# Patient Record
Sex: Female | Born: 1985 | Race: White | Hispanic: No | Smoking: Never smoker
Health system: Southern US, Community
[De-identification: ages and names within clinical notes are randomized; demographics above are authoritative.]

## PROBLEM LIST (undated history)

## (undated) DIAGNOSIS — Z87891 Personal history of nicotine dependence: Secondary | ICD-10-CM

## (undated) HISTORY — DX: Personal history of nicotine dependence: Z87.891

---
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
## 2007-04-16 ENCOUNTER — Emergency Department (HOSPITAL_COMMUNITY): Admission: EM | Admit: 2007-04-16 | Discharge: 2007-04-16 | Payer: Self-pay | Admitting: Emergency Medicine
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
## 2010-09-28 NOTE — Op Note (Signed)
NAMEKENZLEE, FISHBURN                      ACCOUNT NO.:  [id number]   MEDICAL RECORD NO.:  [id number]                   PATIENT TYPE:  OIB   LOCATION:  2872                                 FACILITY:  [facility]   PHYSICIAN:  Duke Salvia, M.D.               DATE OF BIRTH:  1986-02-08   DATE OF PROCEDURE:  01/06/2004  DATE OF DISCHARGE:  01/06/2004                                 OPERATIVE REPORT   PREOPERATIVE DIAGNOSIS:  Syncope.   POSTOPERATIVE DIAGNOSIS:  Syncope.   OPERATION/PROCEDURE:  Head-up tilt-table testing.   DESCRIPTION OF PROCEDURE:  The patient was equilibrated in the supine  position and then tilted upright at 70 degrees from 30 minutes.  Her blood  pressure and heart rate were essentially stable with a change in her supine  position blood pressure in the 120s into the mid 110s or so.  There were  occasional drops below that with equilibration.  The heart rate was started  in the mid 70s, went up as high as 106 or 109 to 120 and then it  equilibrated.   This is consistent with mild orthostasis but otherwise negative                                               Duke Salvia, M.D.    SCK/MEDQ  D:  01/06/2004  T:  01/08/2004  Job:  [id number]

## 2011-10-16 DIAGNOSIS — Z87440 Personal history of urinary (tract) infections: Secondary | ICD-10-CM | POA: Insufficient documentation

## 2011-11-11 ENCOUNTER — Ambulatory Visit: Payer: Self-pay

## 2011-11-15 ENCOUNTER — Ambulatory Visit (INDEPENDENT_AMBULATORY_CARE_PROVIDER_SITE_OTHER): Payer: BC Managed Care – PPO

## 2011-11-15 VITALS — BP 120/70 | Resp 14 | Ht 68.0 in | Wt 159.0 lb

## 2011-11-15 DIAGNOSIS — Z87891 Personal history of nicotine dependence: Secondary | ICD-10-CM | POA: Insufficient documentation

## 2011-11-15 DIAGNOSIS — Z9889 Other specified postprocedural states: Secondary | ICD-10-CM

## 2011-11-15 DIAGNOSIS — N390 Urinary tract infection, site not specified: Secondary | ICD-10-CM

## 2011-11-15 DIAGNOSIS — Z01419 Encounter for gynecological examination (general) (routine) without abnormal findings: Secondary | ICD-10-CM

## 2011-11-15 DIAGNOSIS — Z87440 Personal history of urinary (tract) infections: Secondary | ICD-10-CM

## 2011-11-15 DIAGNOSIS — Z124 Encounter for screening for malignant neoplasm of cervix: Secondary | ICD-10-CM

## 2011-11-15 HISTORY — DX: Personal history of nicotine dependence: Z87.891

## 2011-11-15 LAB — POCT URINALYSIS DIPSTICK
Bilirubin, UA: NEGATIVE
Blood, UA: NEGATIVE
Nitrite, UA: NEGATIVE
Protein, UA: NEGATIVE
Spec Grav, UA: 1.01
Urobilinogen, UA: NEGATIVE
pH, UA: 6

## 2011-11-15 MED ORDER — NITROFURANTOIN MACROCRYSTAL 50 MG PO CAPS: 50.0000 mg | ORAL_CAPSULE | Freq: Every day | ORAL | Status: AC | PRN

## 2011-11-15 NOTE — Progress Notes (Signed)
Regular Periods: yes Mammogram: no  Monthly Breast Ex.: no Exercise: yes  Tetanus < 10 years: yes Seatbelts: yes  NI. Bladder Functn.: yes Abuse at home: no  Daily BM's: yes Stressful Work: no  Healthy Diet: yes Sigmoid-Colonoscopy: never   Calcium: no Medical problems this year: pt reqs Macrodantin states recurrent UTI's     LAST PAP:02/06/2010  WNL   Contraception: Sronyx  Mammogram:  never  PCP: none  PMH:  No changes   FMH: no changes

## 2011-11-18 LAB — PAP IG W/ RFLX HPV ASCU

## 2011-11-18 LAB — URINE CULTURE

## 2012-08-08 DIAGNOSIS — Z9889 Other specified postprocedural states: Secondary | ICD-10-CM | POA: Insufficient documentation

## 2012-08-08 NOTE — Progress Notes (Signed)
..   Subjective:    Brooke Maynard is a 26y.o SWF, P0020., who presents for an annual exam.   Regular Periods: yes  Mammogram: no   Monthly Breast Ex.: no  Exercise: yes   Tetanus < 10 years: yes  Seatbelts: yes   NI. Bladder Functn.: yes  Abuse at home: no   Daily BM's: yes  Stressful Work: no   Healthy Diet: yes  Sigmoid-Colonoscopy: never   Calcium: no  Medical problems this year: pt reqs Macrodantin states recurrent UTI's   LAST PAP:02/06/2010 WNL  Contraception: Sronyx  Mammogram: never  PCP: none  PMH: No changes  FMH: no changes     History   Social History  . Marital Status: Single    Spouse Name: N/A    Number of Children: N/A  . Years of Education: N/A   Social History Main Topics  . Smoking status: Never Smoker   . Smokeless tobacco: Never Used  . Alcohol Use: 0.5 oz/week    1 drink(s) per week  . Drug Use: No  . Sexually Active: Yes -- Female partner(s)    Birth Control/ Protection: Pill   Other Topics Concern  . None   Social History Narrative  . None    Menstrual cycle:   LMP: Patient's last menstrual period was 10/16/2011.           Cycle: monthly  The following portions of the patient's history were reviewed and updated as appropriate: allergies, current medications, past family history, past medical history, past social history, past surgical history and problem list.  Review of Systems Pertinent items are noted in HPI. Breast:Negative for breast lump,nipple discharge or nipple retraction Gastrointestinal: Negative for abdominal pain, change in bowel habits or rectal bleeding Urinary:see HPI   Objective:    BP 120/70  Resp 14  Ht 5\' 8"  (1.727 m)  Wt 159 lb (72.122 kg)  BMI 24.18 kg/m2  LMP 10/16/2011    Weight:  Wt Readings from Last 1 Encounters:  11/15/11 159 lb (72.122 kg)          BMI: Body mass index is 24.18 kg/(m^2).  General Appearance: Alert, appropriate appearance for age. No acute distress HEENT: Grossly normal Neck /  Thyroid: Supple, no masses, nodes or enlargement Lungs: clear to auscultation bilaterally Back: No CVA tenderness Breast Exam: No dimpling, nipple retraction or discharge. No masses or nodes. and No masses or nodes.No dimpling, nipple retraction or discharge. Cardiovascular: Regular rate and rhythm. S1, S2, no murmur Gastrointestinal: Soft, non-tender, no masses or organomegaly Pelvic Exam: Vulva and vagina appear normal. Bimanual exam reveals normal uterus and adnexa. Rectovaginal: not indicated Lymphatic Exam: Non-palpable nodes in neck, clavicular, axillary, or inguinal regions Skin: no rash or abnormalities Neurologic: Normal gait and speech, no tremor  Psychiatric: Alert and oriented, appropriate affect.   Wet Prep:not applicable Urinalysis:small ketones; trace leuks UPT: Not done   Assessment:    Normal gyn exam    Plan:   urine cx sent: 15,000 colonies, Staph species--not treated; Macrodantin Rx for prn use Mammogram--not due pap smear done, next pap due 2017 return annually or prn STD screening: declined Contraception:oral contraceptives (estrogen/progesterone): Sronyx   C. Denny Levy, CNM

## 2015-05-09 ENCOUNTER — Ambulatory Visit (INDEPENDENT_AMBULATORY_CARE_PROVIDER_SITE_OTHER): Payer: BLUE CROSS/BLUE SHIELD

## 2015-05-09 ENCOUNTER — Encounter: Payer: Self-pay | Admitting: Podiatry

## 2015-05-09 ENCOUNTER — Ambulatory Visit (INDEPENDENT_AMBULATORY_CARE_PROVIDER_SITE_OTHER): Payer: BLUE CROSS/BLUE SHIELD | Admitting: Podiatry

## 2015-05-09 VITALS — BP 98/69 | HR 86 | Resp 16

## 2015-05-09 DIAGNOSIS — M2010 Hallux valgus (acquired), unspecified foot: Secondary | ICD-10-CM | POA: Diagnosis not present

## 2015-05-09 NOTE — Progress Notes (Signed)
   Subjective:    Patient ID: Brooke Maynard, female    DOB: Sep 28, 1985, 28 y.o.   MRN: [id number]  HPI: She presents today as a 29 year old white female with a continuing pain to the forefoot bilaterally. She states that she has bunion deformities which have been bothering her for many years. Recently noting that the right foot is much worse than the left and has injured the right foot recently as she stepped off a step incorrectly caused the joint to be painful. She states there is red and swollen and even purple in appearance. She states that this deformity is resulting in the inability to refer her to wear regular shoe gear and for her to perform her daily activities of life.    Review of Systems  All other systems reviewed and are negative.      Objective:   Physical Exam: 29 year old female history of hallux abductovalgus times many years recently worsening. No apparent distress. Pulses are strongly palpable neurologic sensorium is intact per Semmes-Weinstein monofilament. Deep tendon reflexes are intact bilateral muscle strength is 5 over 5 dorsiflexion plantar flexors and inverters everters all into the musculature is intact. Orthopedic evaluation demonstrates moderate to severe hallux abductovalgus deformities. She also has tailor's bunion deformities. Radiographs confirm an increase in the first metatarsal angle greater than normal value bilateral as well as the fourth intermetatarsal angle. She has tenderness on range of motion of these joints. Cutaneous evaluation of a straight supple well-hydrated cutis no erythema edema cellulitis drainage or odor.      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  Plan: We discussed the etiology pathology conservative versus surgical therapies. At this point I recommended surgical correction [facility] and a  fifth metatarsal osteotomy right foot. I answered questions regarding these procedures today to the best of my ability in layman's terms. We did discuss the possible postop complications which may include but are not limited to postop pain bleeding swelling infection recurrence need for further surgery overcorrection under correction loss of digit loss of limb, loss of life. We dispensed a cam walker and discussed anesthesia in great detail. I will follow-up with her in the near future for surgical intervention.

## 2015-05-09 NOTE — Patient Instructions (Signed)
Pre-Operative Instructions  Congratulations, you have decided to take an important step to improving your quality of life.  You can be assured that the doctors of [facility] will be with you every step of the way.  1. Plan to be at the surgery center/hospital at least 1 (one) hour prior to your scheduled time unless otherwise directed by the surgical center/hospital staff.  You must have a responsible adult accompany you, remain during the surgery and drive you home.  Make sure you have directions to the surgical center/hospital and know how to get there on time. 2. For hospital based surgery you will need to obtain a history and physical form from your family physician within 1 month prior to the date of surgery- we will give you a form for you primary physician.  3. We make every effort to accommodate the date you request for surgery.  There are however, times where surgery dates or times have to be moved.  We will contact you as soon as possible if a change in schedule is required.   4. No Aspirin/Ibuprofen for one week before surgery.  If you are on aspirin, any non-steroidal anti-inflammatory medications (Mobic, Aleve, Ibuprofen) you should stop taking it 7 days prior to your surgery.  You make take Tylenol  For pain prior to surgery.  5. Medications- If you are taking daily heart and blood pressure medications, seizure, reflux, allergy, asthma, anxiety, pain or diabetes medications, make sure the surgery center/hospital is aware before the day of surgery so they may notify you which medications to take or avoid the day of surgery. 6. No food or drink after midnight the night before surgery unless directed otherwise by surgical center/hospital staff. 7. No alcoholic beverages 24 hours prior to surgery.  No smoking 24 hours prior to or 24 hours after surgery. 8. Wear loose pants or shorts- loose enough to fit over bandages, boots, and casts. 9. No slip on shoes, sneakers are best. 10. Bring  your boot with you to the surgery center/hospital.  Also bring crutches or a walker if your physician has prescribed it for you.  If you do not have this equipment, it will be provided for you after surgery. 11. If you have not been contracted by the surgery center/hospital by the day before your surgery, call to confirm the date and time of your surgery. 12. Leave-time from work may vary depending on the type of surgery you have.  Appropriate arrangements should be made prior to surgery with your employer. 13. Prescriptions will be provided immediately following surgery by your doctor.  Have these filled as soon as possible after surgery and take the medication as directed. 14. Remove nail polish on the operative foot. 15. Wash the night before surgery.  The night before surgery wash the foot and leg well with the antibacterial soap provided and water paying special attention to beneath the toenails and in between the toes.  Rinse thoroughly with water and dry well with a towel.  Perform this wash unless told not to do so by your physician.  Enclosed: 1 Ice pack (please put in freezer the night before surgery)   1 Hibiclens skin cleaner   Pre-op Instructions  If you have any questions regarding the instructions, do not hesitate to call our office.  [place]: [address] , [place] [address] [phone]  [place]: [address] [place], [place] [address] [phone]  [place]: [address]  [place], [place] [address] [phone]  Dr. Richard   Tuchman DPM, Dr. Norman Regal DPM Dr. Richard Sikora DPM, Dr. M. Todd Hyatt DPM, Dr. Kathryn Egerton DPM 

## 2015-07-04 ENCOUNTER — Telehealth: Payer: Self-pay | Admitting: *Deleted

## 2015-07-04 NOTE — Telephone Encounter (Signed)
"  My daughter wanted me to call you and set up a date for her surgery finally.  She's already signed the needed forms."  When would she like to schedule?  "She would like to do it on April 21."  That date is available.  She will need to register with the surgical center.  Instructions are in the surgical center brochure.  Surgical center will call with the arrival time a day or two prior to surgery date.  "I'm writing this down, I will pass this information to her."

## 2015-08-31 ENCOUNTER — Other Ambulatory Visit: Payer: Self-pay | Admitting: Podiatry

## 2015-08-31 MED ORDER — CEPHALEXIN 500 MG PO CAPS: 500.0000 mg | ORAL_CAPSULE | Freq: Three times a day (TID) | ORAL | Status: DC

## 2015-08-31 MED ORDER — ONDANSETRON HCL 4 MG PO TABS: 4.0000 mg | ORAL_TABLET | Freq: Three times a day (TID) | ORAL | Status: DC | PRN

## 2015-08-31 MED ORDER — HYDROMORPHONE HCL 4 MG PO TABS: 4.0000 mg | ORAL_TABLET | Freq: Four times a day (QID) | ORAL | Status: DC | PRN

## 2015-09-01 ENCOUNTER — Encounter: Payer: Self-pay | Admitting: Podiatry

## 2015-09-01 DIAGNOSIS — M21541 Acquired clubfoot, right foot: Secondary | ICD-10-CM | POA: Diagnosis not present

## 2015-09-01 DIAGNOSIS — M2011 Hallux valgus (acquired), right foot: Secondary | ICD-10-CM | POA: Diagnosis not present

## 2015-09-04 ENCOUNTER — Telehealth: Payer: Self-pay | Admitting: *Deleted

## 2015-09-04 NOTE — Telephone Encounter (Signed)
Post op courtesy call-Left message instruction pt to not be up on the foot more than 5 minutes/hour, remain in the surgical boot at all times even to sleep and to walk, RICE, leave dressing in place until 1st Post op visit, and to call with concerns.

## 2015-09-05 NOTE — Progress Notes (Signed)
[facility] 09/01/2015 Austin bunionectomy with internal screw fixation right and metatarsal osteotomy 5th met with internal screw fixation right foot.

## 2015-09-07 ENCOUNTER — Encounter: Payer: Self-pay | Admitting: Podiatry

## 2015-09-07 ENCOUNTER — Ambulatory Visit (INDEPENDENT_AMBULATORY_CARE_PROVIDER_SITE_OTHER): Payer: BLUE CROSS/BLUE SHIELD | Admitting: Podiatry

## 2015-09-07 ENCOUNTER — Ambulatory Visit (INDEPENDENT_AMBULATORY_CARE_PROVIDER_SITE_OTHER): Payer: BLUE CROSS/BLUE SHIELD

## 2015-09-07 VITALS — BP 124/60 | HR 86 | Resp 16

## 2015-09-07 DIAGNOSIS — M2011 Hallux valgus (acquired), right foot: Secondary | ICD-10-CM | POA: Diagnosis not present

## 2015-09-07 DIAGNOSIS — Z9889 Other specified postprocedural states: Secondary | ICD-10-CM

## 2015-09-07 NOTE — Progress Notes (Signed)
She presents today with her mother for her first postop visit regarding Austin bunion repair right foot and a fifth metatarsal osteotomy right foot. She states that she uses a walker because she is scared to put weight on the foot she is afraid a squat hurts. She denies fever chills nausea vomiting muscle aches pain is no chest pain. No shortness of breath.  Objective: Vital signs are stable alert and oriented 3. Pulses are strongly palpable. Dry sterile dressing were once removed demonstrates mild edema some ecchymosis no cellulitis drainage or odor mild tenderness on range of motion of the first metatarsophalangeal joint margins appear to be well coapted with sutures intact. Radiograph does demonstrate a first and fifth metatarsal osteotomy with screw fixation intact and in good position.  Assessment: Well-healing surgical foot right.  Plan: Encouraged range of motion exercises redressed the foot today with a dry sterile compressive dressing and I will follow-up with her in 1 week. She is to continue to keep this elevated and dry and perform her exercises.

## 2015-09-19 ENCOUNTER — Encounter: Payer: BLUE CROSS/BLUE SHIELD | Admitting: Podiatry

## 2015-09-21 ENCOUNTER — Ambulatory Visit (INDEPENDENT_AMBULATORY_CARE_PROVIDER_SITE_OTHER): Payer: BLUE CROSS/BLUE SHIELD | Admitting: Podiatry

## 2015-09-21 DIAGNOSIS — Z9889 Other specified postprocedural states: Secondary | ICD-10-CM

## 2015-09-21 DIAGNOSIS — M2011 Hallux valgus (acquired), right foot: Secondary | ICD-10-CM

## 2015-09-22 NOTE — Progress Notes (Signed)
Subjective:     Patient ID: Brooke Maynard, female   DOB: Dec 25, 1985, 30 y.o.   MRN: [id number]  HPI patient presents stating she's doing well with her foot and is able now to walk without significant discomfort or swelling   Review of Systems     Objective:   Physical Exam Neurovascular status intact muscle strength adequate negative Homans sign noted with patient having well structured first metatarsal right and fifth metatarsal right with wound edges well coapted and good alignment noted    Assessment:     Doing well post osteotomy first and fifth metatarsal right    Plan:     X-ray reviewed and reapplied sterile dressing and advised on continued elevation compression and gradual increase in activity. Encourage range of motion of the first MPJ and patient will be reevaluated again in the next 2-3 weeks or earlier if any issues should occur  X-ray report indicates that the screws are in place with good alignment of the first and fifth metatarsal

## 2015-10-05 ENCOUNTER — Encounter: Payer: Self-pay | Admitting: Podiatry

## 2015-10-05 ENCOUNTER — Ambulatory Visit (INDEPENDENT_AMBULATORY_CARE_PROVIDER_SITE_OTHER): Payer: BLUE CROSS/BLUE SHIELD | Admitting: Podiatry

## 2015-10-05 ENCOUNTER — Ambulatory Visit (INDEPENDENT_AMBULATORY_CARE_PROVIDER_SITE_OTHER): Payer: BLUE CROSS/BLUE SHIELD

## 2015-10-05 VITALS — BP 117/88 | HR 79 | Resp 12

## 2015-10-05 DIAGNOSIS — Z9889 Other specified postprocedural states: Secondary | ICD-10-CM

## 2015-10-05 DIAGNOSIS — M2011 Hallux valgus (acquired), right foot: Secondary | ICD-10-CM | POA: Diagnosis not present

## 2015-10-05 NOTE — Progress Notes (Signed)
She presents today date of surgery 09/01/2015 status post first and fifth metatarsal osteotomies. She states that she still has some swelling.  Objective: Vital signs are stable she is alert and oriented 3 minimal edema no saline as drainage or odor. Sutures were removed today margins are well coapted. She has limited range of motion of the first metatarsophalangeal joint secondary to noncompliance. Radiographs confirm well healing osteotomies though they're not completely healed they are in good alignment with good internal fixation.  Assessment: Well-healing surgical foot right.  Plan: Encouraged range of motion exercises I also encouraged her to get back into a loose fitting shoe that would help increase the range of motion. I also will follow up with her in 2-4 weeks for another set of x-rays.

## 2015-10-19 ENCOUNTER — Ambulatory Visit (INDEPENDENT_AMBULATORY_CARE_PROVIDER_SITE_OTHER): Payer: BLUE CROSS/BLUE SHIELD

## 2015-10-19 ENCOUNTER — Encounter: Payer: Self-pay | Admitting: Podiatry

## 2015-10-19 ENCOUNTER — Ambulatory Visit (INDEPENDENT_AMBULATORY_CARE_PROVIDER_SITE_OTHER): Payer: BLUE CROSS/BLUE SHIELD | Admitting: Podiatry

## 2015-10-19 VITALS — BP 117/76 | HR 85 | Resp 16

## 2015-10-19 DIAGNOSIS — M2011 Hallux valgus (acquired), right foot: Secondary | ICD-10-CM

## 2015-10-19 DIAGNOSIS — Z9889 Other specified postprocedural states: Secondary | ICD-10-CM

## 2015-10-19 NOTE — Progress Notes (Signed)
She presents today for follow-up of her surgical foot right date of surgery was 09/01/2015. She states this seems to be doing better as she limps to the room with a pair slide on shoes. She denies any trauma to the foot states that is doing better and that she's been exercising her toes.  Objective: Vital signs are stable she is alert and oriented 3 pulses are palpable right foot. She is status post Austin bunion repair fifth metatarsal osteotomy which are very stiff on attempted range of motion today. Radiographs confirm well healing osteotomies.  Assessment: Status post 6 weeks Austin bunion repair and fifth metatarsal osteotomy right foot. Stiff joints.  Plan: I encouraged range of motion exercises and we are going to send her to [facility] for physical therapy. Follow up with her in 1 month

## 2015-10-20 ENCOUNTER — Telehealth: Payer: Self-pay | Admitting: *Deleted

## 2015-10-20 NOTE — Telephone Encounter (Signed)
Faxed referral and required PT form with pt demographics.

## 2015-11-16 ENCOUNTER — Ambulatory Visit (INDEPENDENT_AMBULATORY_CARE_PROVIDER_SITE_OTHER): Payer: BLUE CROSS/BLUE SHIELD

## 2015-11-16 ENCOUNTER — Ambulatory Visit (INDEPENDENT_AMBULATORY_CARE_PROVIDER_SITE_OTHER): Payer: BLUE CROSS/BLUE SHIELD | Admitting: Podiatry

## 2015-11-16 ENCOUNTER — Encounter: Payer: Self-pay | Admitting: Podiatry

## 2015-11-16 DIAGNOSIS — Z9889 Other specified postprocedural states: Secondary | ICD-10-CM | POA: Diagnosis not present

## 2015-11-16 DIAGNOSIS — M2011 Hallux valgus (acquired), right foot: Secondary | ICD-10-CM | POA: Diagnosis not present

## 2015-11-16 NOTE — Progress Notes (Signed)
She presents today status post ostomy repair fifth metatarsal osteotomy right foot. She states it is sore occasionally getting better. Date of surgery 09/01/2015.  Objective: Vital signs are stable she is alert and oriented 3 there is no erythema edema saline astringent odor surgical site appears to have gone heal uneventfully. She has good correction of the first metatarsophalangeal joint radiographically however she still has some lateral deviation of the hallux right. Fifth metatarsal osteotomy appears to be healing very well with its internal fixation.  Assessment: Healing surgical foot 2-1/2 months.  Plan: I recommended she continue at-home physical therapy and follow up with me on an as-needed basis.

## 2016-06-11 ENCOUNTER — Ambulatory Visit (INDEPENDENT_AMBULATORY_CARE_PROVIDER_SITE_OTHER): Payer: BLUE CROSS/BLUE SHIELD | Admitting: Podiatry

## 2016-06-11 ENCOUNTER — Encounter: Payer: Self-pay | Admitting: Podiatry

## 2016-06-11 ENCOUNTER — Ambulatory Visit (INDEPENDENT_AMBULATORY_CARE_PROVIDER_SITE_OTHER): Payer: BLUE CROSS/BLUE SHIELD

## 2016-06-11 DIAGNOSIS — M79672 Pain in left foot: Secondary | ICD-10-CM | POA: Diagnosis not present

## 2016-06-11 DIAGNOSIS — M79671 Pain in right foot: Secondary | ICD-10-CM | POA: Diagnosis not present

## 2016-06-11 DIAGNOSIS — M2012 Hallux valgus (acquired), left foot: Secondary | ICD-10-CM

## 2016-06-11 NOTE — Progress Notes (Signed)
She presents today for a follow-up of her first metatarsophalangeal joint. She had surgery last April regarding bunion repair and a fifth met osteotomy. She states that the lateral side of the foot is doing fine however she still has some tenderness on palpation plantar aspect of the first metatarsophalangeal joint and states that still tender. She is concerned about doing the left foot says the right foot is not completely well yet.  Objective: Vital signs are stable she is alert and oriented 3. Pulses are palpable. Neurologic sensorium is intact. Deep tendon reflexes are intact. Muscle strength is normal. Radiographic evaluation does demonstrate hallux valgus deformity bilateral though she is a congruous joint of the first metatarsophalangeal joint she has a dislocated joint of the first metatarsophalangeal joint left foot. She does have pain on palpation of the first metatarsophalangeal joint plantarly with the tibial sesamoid right foot.  Assessment: Residual pain status post Austin bunion repair right to tibial sesamoid. Hallux valgus left.  Plan: I'll follow-up with her in about 4 months and discuss surgical intervention regarding the right foot and the left foot.

## 2016-08-20 ENCOUNTER — Ambulatory Visit: Payer: BLUE CROSS/BLUE SHIELD | Admitting: Podiatry

## 2016-09-10 ENCOUNTER — Ambulatory Visit: Payer: BLUE CROSS/BLUE SHIELD | Admitting: Podiatry

## 2017-05-27 ENCOUNTER — Ambulatory Visit (INDEPENDENT_AMBULATORY_CARE_PROVIDER_SITE_OTHER): Payer: 59

## 2017-05-27 ENCOUNTER — Ambulatory Visit: Payer: 59 | Admitting: Podiatry

## 2017-05-27 ENCOUNTER — Encounter: Payer: Self-pay | Admitting: Podiatry

## 2017-05-27 DIAGNOSIS — M2011 Hallux valgus (acquired), right foot: Secondary | ICD-10-CM

## 2017-05-27 DIAGNOSIS — M2012 Hallux valgus (acquired), left foot: Secondary | ICD-10-CM

## 2017-05-27 NOTE — Patient Instructions (Signed)
Pre-Operative Instructions  Congratulations, you have decided to take an important step towards improving your quality of life.  You can be assured that the doctors and staff at [facility] will be with you every step of the way.  Here are some important things you should know:  1. Plan to be at the surgery center/hospital at least 1 (one) hour prior to your scheduled time, unless otherwise directed by the surgical center/hospital staff.  You must have a responsible adult accompany you, remain during the surgery and drive you home.  Make sure you have directions to the surgical center/hospital to ensure you arrive on time. 2. If you are having surgery at [facility] or [facility], you will need a copy of your medical history and physical form from your family physician within one month prior to the date of surgery. We will give you a form for your primary physician to complete.  3. We make every effort to accommodate the date you request for surgery.  However, there are times where surgery dates or times have to be moved.  We will contact you as soon as possible if a change in schedule is required.   4. No aspirin/ibuprofen for one week before surgery.  If you are on aspirin, any non-steroidal anti-inflammatory medications (Mobic, Aleve, Ibuprofen) should not be taken seven (7) days prior to your surgery.  You make take Tylenol for pain prior to surgery.  5. Medications - If you are taking daily heart and blood pressure medications, seizure, reflux, allergy, asthma, anxiety, pain or diabetes medications, make sure you notify the surgery center/hospital before the day of surgery so they can tell you which medications you should take or avoid the day of surgery. 6. No food or drink after midnight the night before surgery unless directed otherwise by surgical center/hospital staff. 7. No alcoholic beverages 24-hours prior to surgery.  No smoking 24-hours prior or 24-hours after  surgery. 8. Wear loose pants or shorts. They should be loose enough to fit over bandages, boots, and casts. 9. Don't wear slip-on shoes. Sneakers are preferred. 10. Bring your boot with you to the surgery center/hospital.  Also bring crutches or a walker if your physician has prescribed it for you.  If you do not have this equipment, it will be provided for you after surgery. 11. If you have not been contacted by the surgery center/hospital by the day before your surgery, call to confirm the date and time of your surgery. 12. Leave-time from work may vary depending on the type of surgery you have.  Appropriate arrangements should be made prior to surgery with your employer. 13. Prescriptions will be provided immediately following surgery by your doctor.  Fill these as soon as possible after surgery and take the medication as directed. Pain medications will not be refilled on weekends and must be approved by the doctor. 14. Remove nail polish on the operative foot and avoid getting pedicures prior to surgery. 15. Wash the night before surgery.  The night before surgery wash the foot and leg well with water and the antibacterial soap provided. Be sure to pay special attention to beneath the toenails and in between the toes.  Wash for at least three (3) minutes. Rinse thoroughly with water and dry well with a towel.  Perform this wash unless told not to do so by your physician.  Enclosed: 1 Ice pack (please put in freezer the night before surgery)   1 Hibiclens skin cleaner     Pre-op instructions  If you have any questions regarding the instructions, please do not hesitate to call our office.  [place]: [address], [place], [place] [address] -- [phone]  [place]: [address] [place], [place] [address] -- [phone]  [place]: [address]  [place], [place] [address] -- [phone]  [place]: [address], [place], [place] [address] -- [phone]  Website:  [email or web address] 

## 2017-05-27 NOTE — Progress Notes (Signed)
She presents today for follow-up and surgical consultation regarding her left foot.  She states that she has been doing well with her right foot since the time of surgery and would like a matching set.  States that the left foot has become more painful as time goes on particularly rubbing around the first metatarsophalangeal joint and the fifth metatarsal phalangeal joint with shoe gear.  She has no major changes in her past medical history medications or allergies.  Objective: Vital signs are stable alert and oriented x3.  Pulses are strongly palpable.  Neurologic sensorium is intact.  Deep tendon reflexes are intact.  Muscle strength was 5/5 dorsiflexors plantar flexors inverters and everters all intrinsic musculature is intact.  She still has some mild residual valgus deformity of the first metatarsophalangeal joint of the right foot but more so on the left.  There is mild reactive hyperkeratosis along the medial aspect of the first metatarsophalangeal joint and overlying the fifth metatarsal phalangeal joint as well.  Radiographs taken today do demonstrate internal fixation is in good but good position first and fifth metatarsals right foot.  Left foot does demonstrate an increase in the first intermetatarsal angle greater than normal value in the hallux abductus angle greater than normal value.  Pulses are strongly palpable neurologic sensorium is intact and I am not concerned about any type of problems postoperatively with her.  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  Plan: Discussed etiology pathology conservative versus surgical therapies.  At this point surgical therapy is indicated and we [facility] with [facility] fixation and 1/5 metatarsal osteotomy with screw fixation.  She tolerated this discussion well we discussed the possible postop complications which may include but are not limited to postop pain bleeding  swelling infection recurrence and need for further surgery.  She understands this and is amenable to it already has her Cam walker and she will utilize that same Cam walker postoperatively.  She was provided with both oral and written home-going instructions for preop as well as the surgical facility and anesthesia.

## 2017-06-30 ENCOUNTER — Other Ambulatory Visit: Payer: Self-pay | Admitting: Podiatry

## 2017-06-30 MED ORDER — PROMETHAZINE HCL 25 MG PO TABS: 25.0000 mg | ORAL_TABLET | Freq: Three times a day (TID) | ORAL | 0 refills | Status: DC | PRN

## 2017-06-30 MED ORDER — OXYCODONE-ACETAMINOPHEN 10-325 MG PO TABS: 1.0000 | ORAL_TABLET | ORAL | 0 refills | Status: DC | PRN

## 2017-06-30 MED ORDER — CEPHALEXIN 500 MG PO CAPS: 500.0000 mg | ORAL_CAPSULE | Freq: Three times a day (TID) | ORAL | 0 refills | Status: DC

## 2017-07-04 ENCOUNTER — Encounter: Payer: Self-pay | Admitting: Podiatry

## 2017-07-04 ENCOUNTER — Telehealth: Payer: Self-pay | Admitting: *Deleted

## 2017-07-04 DIAGNOSIS — M21542 Acquired clubfoot, left foot: Secondary | ICD-10-CM | POA: Diagnosis not present

## 2017-07-04 DIAGNOSIS — M2012 Hallux valgus (acquired), left foot: Secondary | ICD-10-CM | POA: Diagnosis not present

## 2017-07-04 NOTE — Telephone Encounter (Signed)
"  I'm calling in regards to Brooke Maynard who is scheduled for surgery tomorrow.  Her surgery has been approved for the following codes, [id number] and 28308.  They have been approved under service record, [id number].  It will be followed by hard copy approvals in the mail.

## 2017-07-10 ENCOUNTER — Ambulatory Visit (INDEPENDENT_AMBULATORY_CARE_PROVIDER_SITE_OTHER): Payer: 59 | Admitting: Podiatry

## 2017-07-10 ENCOUNTER — Ambulatory Visit (INDEPENDENT_AMBULATORY_CARE_PROVIDER_SITE_OTHER): Payer: 59

## 2017-07-10 ENCOUNTER — Encounter: Payer: Self-pay | Admitting: Podiatry

## 2017-07-10 VITALS — BP 113/83 | HR 88 | Temp 99.3°F

## 2017-07-10 DIAGNOSIS — M2012 Hallux valgus (acquired), left foot: Secondary | ICD-10-CM

## 2017-07-10 DIAGNOSIS — M2011 Hallux valgus (acquired), right foot: Secondary | ICD-10-CM

## 2017-07-10 DIAGNOSIS — M21622 Bunionette of left foot: Secondary | ICD-10-CM

## 2017-07-10 NOTE — Progress Notes (Signed)
She presents today for her first postop visit date of surgery was July 04, 2017 status post [facility] b[facility] and fifth metatarsal osteotomy left.  She states that this foot did much better than my left one did she presents with her mother utilizing a roller type walker.  Objective: Vital signs are stable she is alert and oriented x3 pulses are strongly palpable dry sterile dressing was removed demonstrates mild edema some ecchymosis no erythema cellulitis drainage or odor she has great range of motion of the first metatarsophalangeal joint.  Radiograph taken today demonstrate a first and fifth metatarsal osteotomy with internal fixation which is intact.  Alignment is good.  Assessment: Well-healing surgical foot status post 1 week.  Plan: Redressed today dresser compressive dressing follow-up with 1 of our doctors in 1 week for redress and possible suture removal.  She may be placed in a Darco shoe at that point.  Follow-up with me 2 weeks after that.

## 2017-07-17 ENCOUNTER — Ambulatory Visit (INDEPENDENT_AMBULATORY_CARE_PROVIDER_SITE_OTHER): Payer: 59

## 2017-07-17 ENCOUNTER — Ambulatory Visit (INDEPENDENT_AMBULATORY_CARE_PROVIDER_SITE_OTHER): Payer: 59 | Admitting: Podiatry

## 2017-07-17 DIAGNOSIS — M21622 Bunionette of left foot: Secondary | ICD-10-CM

## 2017-07-17 DIAGNOSIS — M2012 Hallux valgus (acquired), left foot: Secondary | ICD-10-CM

## 2017-07-21 NOTE — Progress Notes (Signed)
[place] bunion repair with screws left. Fifth metatarsal osteotomy with screws left.

## 2017-07-31 ENCOUNTER — Ambulatory Visit (INDEPENDENT_AMBULATORY_CARE_PROVIDER_SITE_OTHER): Payer: 59 | Admitting: Podiatry

## 2017-07-31 ENCOUNTER — Encounter: Payer: Self-pay | Admitting: Podiatry

## 2017-07-31 ENCOUNTER — Ambulatory Visit (INDEPENDENT_AMBULATORY_CARE_PROVIDER_SITE_OTHER): Payer: 59

## 2017-07-31 DIAGNOSIS — M2012 Hallux valgus (acquired), left foot: Secondary | ICD-10-CM

## 2017-07-31 DIAGNOSIS — M21622 Bunionette of left foot: Secondary | ICD-10-CM | POA: Diagnosis not present

## 2017-07-31 NOTE — Progress Notes (Signed)
She presents today 1 month status post [facility] bunion repair left and fifth metatarsal osteotomy with screws left.  She states that this is doing so much better than the other one did.  She presents today ambulating with an antalgic gait to the left lower extremity walking on the heel of her Darco shoe.  Objective: Vital signs are stable she is alert and oriented x3.  Pulses are palpable.  Minimal edema no erythema cellulitis drainage or odor somewhat limited on range of motion of the first metatarsophalangeal because she is not walking a normal heel toe gait is she is not doing her exercises.  Radiograph taken today demonstrate a well healing first and fifth met osteotomies which have not moved from surgical day.  Assessment well-healing surgical foot left.  Plan: Encourage range of motion exercises and ambulation heel to toe gait.  She states that she would try to do this so I will send her to physical therapy.  Follow-up with her in 2 weeks

## 2017-08-10 NOTE — Progress Notes (Signed)
  Subjective:  Patient ID: Brooke Maynard, female    DOB: Mar 07, 1986,  MRN: [id number]  Chief Complaint  Patient presents with  . [facility]    [facility] [place] [facility] [place] Metatarsal Osteotomy 5th Lt, " my foot feels great"    DOS: 07/04/17 Procedure: L [place] bunionectomy, L 5th metatarsal osteotomy  32 y.o. female returns for post-op check. Denies N/V/F/Ch. Pain is controlled with current medications.  Objective:   General AA&O x3. Normal mood and affect.  Vascular Foot warm and well perfused.  Neurologic Gross sensation intact.  Dermatologic Skin healing well without signs of infection. Skin edges well coapted without signs of infection.  Orthopedic: Tenderness to palpation noted about the surgical site.    Assessment & Plan:  Patient was evaluated and treated and all questions answered.  S/p L [place] bunionectomy, L 5th metatarsal osteotomy -X-rays taken reviewed no change position no evidence of hardware failure. -Progressing as expected post-operatively. -Sutures: out. -Medications refilled: none -Foot redressed.  Return in about 2 weeks (around 07/31/2017) for hyatt patient.

## 2017-08-14 ENCOUNTER — Ambulatory Visit (INDEPENDENT_AMBULATORY_CARE_PROVIDER_SITE_OTHER): Payer: 59

## 2017-08-14 ENCOUNTER — Encounter: Payer: Self-pay | Admitting: Podiatry

## 2017-08-14 ENCOUNTER — Ambulatory Visit (INDEPENDENT_AMBULATORY_CARE_PROVIDER_SITE_OTHER): Payer: 59 | Admitting: Podiatry

## 2017-08-14 DIAGNOSIS — M2012 Hallux valgus (acquired), left foot: Secondary | ICD-10-CM | POA: Diagnosis not present

## 2017-08-14 DIAGNOSIS — M21622 Bunionette of left foot: Secondary | ICD-10-CM

## 2017-08-14 NOTE — Progress Notes (Signed)
She presents today 6 weeks status post [place] bunionectomy and fifth metatarsal osteotomy left foot.  She states that she is doing pretty well.  Objective: Vital signs are stable she is alert and oriented x3.  Pulses are palpable.  There is minimal edema no erythema cellulitis drainage or odor she has good range of motion on plantarflexion but a bit limited on dorsiflexion.  She needs to work with this more.  Radiographs taken today demonstrate well healing osteotomies.  Assessment: Well-healing first and fifth met osteotomies left foot.  Plan: Encouraged her start massage therapy and range of motion exercises to the toes.  I will follow-up with her in 1 month hopefully for final x-ray.  She will call sooner if needed.

## 2017-09-16 ENCOUNTER — Ambulatory Visit (INDEPENDENT_AMBULATORY_CARE_PROVIDER_SITE_OTHER): Payer: 59 | Admitting: Podiatry

## 2017-09-16 ENCOUNTER — Ambulatory Visit (INDEPENDENT_AMBULATORY_CARE_PROVIDER_SITE_OTHER): Payer: 59

## 2017-09-16 ENCOUNTER — Encounter: Payer: Self-pay | Admitting: Podiatry

## 2017-09-16 DIAGNOSIS — M2012 Hallux valgus (acquired), left foot: Secondary | ICD-10-CM

## 2017-09-16 DIAGNOSIS — L03039 Cellulitis of unspecified toe: Secondary | ICD-10-CM

## 2017-09-16 DIAGNOSIS — M21622 Bunionette of left foot: Secondary | ICD-10-CM | POA: Diagnosis not present

## 2017-09-16 DIAGNOSIS — L02619 Cutaneous abscess of unspecified foot: Secondary | ICD-10-CM | POA: Insufficient documentation

## 2017-09-16 NOTE — Progress Notes (Signed)
She presents today and states that she was 100% better she refers to her surgery date of surgery is July 04, 2017.  [place] bunionectomy left foot.  Objective: Vital signs are stable she is alert and oriented x3.  There is no erythema edema cellulitis drainage or odor incision site is gone on to heal 100% and she ambulates perfectly normal.  Radiographs taken today demonstrate no movement of the capital osteotomy and well-healing capital fragment.  Assessment: Well-healing surgical foot.  Plan: I will follow-up with her on an as-needed basis.

## 2017-10-08 ENCOUNTER — Other Ambulatory Visit: Payer: Self-pay | Admitting: Podiatry

## 2020-04-20 ENCOUNTER — Encounter: Payer: Self-pay | Admitting: Podiatry

## 2020-04-20 ENCOUNTER — Ambulatory Visit (INDEPENDENT_AMBULATORY_CARE_PROVIDER_SITE_OTHER): Payer: BLUE CROSS/BLUE SHIELD

## 2020-04-20 ENCOUNTER — Ambulatory Visit: Payer: BLUE CROSS/BLUE SHIELD | Admitting: Podiatry

## 2020-04-20 ENCOUNTER — Other Ambulatory Visit: Payer: Self-pay

## 2020-04-20 DIAGNOSIS — M778 Other enthesopathies, not elsewhere classified: Secondary | ICD-10-CM

## 2020-04-20 MED ORDER — MELOXICAM 15 MG PO TABS: 15.0000 mg | ORAL_TABLET | Freq: Every day | ORAL | 3 refills | Status: DC

## 2020-04-20 MED ORDER — TRIAMCINOLONE ACETONIDE 40 MG/ML IJ SUSP
20.0000 mg | Freq: Once | INTRAMUSCULAR | Status: AC
  Administered 2020-04-20: 20 mg

## 2020-04-20 MED ORDER — METHYLPREDNISOLONE 4 MG PO TBPK: ORAL_TABLET | ORAL | 0 refills | Status: DC

## 2020-04-20 NOTE — Progress Notes (Signed)
She presents today for a chief complaint of a painful forefoot left.  She is status post bunion repair 3 years ago and states that over the past few months while working at home she started to develop pain in the forefoot.  Objective: Vital signs are stable she is alert oriented x3 pulses are palpable.  She has pain on end range of motion on palpation of the second metatarsophalangeal joint of the left foot.  Radiographs taken today do demonstrate a well-healed first metatarsal and fifth metatarsal osteotomy with internal fixation.  Slightly elongated plantarflexed second metatarsal no hammertoe deformity.  Assessment: Capsulitis of the second metatarsophalangeal joint left.  Plan: Injected the area today start her on a Medrol Dosepak to be followed by meloxicam.  Discussed appropriate shoe gear stretching exercise ice therapy sugar modifications follow-up with me in 1 month.

## 2020-06-01 ENCOUNTER — Other Ambulatory Visit: Payer: Self-pay

## 2020-06-01 ENCOUNTER — Ambulatory Visit (INDEPENDENT_AMBULATORY_CARE_PROVIDER_SITE_OTHER): Payer: Managed Care, Other (non HMO) | Admitting: Podiatry

## 2020-06-01 ENCOUNTER — Encounter: Payer: Self-pay | Admitting: Podiatry

## 2020-06-01 DIAGNOSIS — M778 Other enthesopathies, not elsewhere classified: Secondary | ICD-10-CM

## 2020-06-01 MED ORDER — CELECOXIB 200 MG PO CAPS: 200.0000 mg | ORAL_CAPSULE | Freq: Two times a day (BID) | ORAL | 3 refills | Status: AC

## 2020-06-01 NOTE — Progress Notes (Signed)
She presents today for follow-up of her pain to her second metatarsal phalangeal joint of her left foot. States that it is still quite well but rather tender and is starting to develop some pain on the right foot as well. She states that she did not wear the surgical shoe the way she should have been wearing it but she did wear her surgical boot occasionally when she was having to do a lot of walking.  Objective: Vital signs are stable alert oriented x3 still has pain on palpation of the second metatarsophalangeal joint left foot as opposed to the right foot.  Assessment: Capsulitis second metatarsophalangeal joint left and right.  Plan: At this point instead of injecting her I think switching her from meloxicam to Celebrex 200 mg twice a day for the next few days I will also go ahead and get her casted for orthotics as well. She we will see Mr. Revonda Humphrey today and have those done.

## 2020-06-29 ENCOUNTER — Encounter: Payer: Managed Care, Other (non HMO) | Admitting: Orthotics

## 2020-07-04 ENCOUNTER — Telehealth: Payer: Self-pay | Admitting: Podiatry

## 2020-07-04 NOTE — Telephone Encounter (Signed)
Pt left message checking on status of orthotics. Her appt was cxled because orthotics where not in.  I called the lab and they are needing her shoe size because order says one thing and scan shows something different.   I left message for pt to call me with her shoe size to make sure we make the orthotics to her shoe size. Once we get this I will call lab and have them rush the orthotics.

## 2020-07-14 ENCOUNTER — Telehealth: Payer: Self-pay | Admitting: Podiatry

## 2020-07-14 NOTE — Telephone Encounter (Signed)
Left message again for pt to call me back with her shoe size as the order says one thing and the scan says something different.Marland Kitchen

## 2020-07-27 ENCOUNTER — Telehealth: Payer: Self-pay | Admitting: Podiatry

## 2020-07-27 NOTE — Telephone Encounter (Signed)
Left voice message to schedule appt to pick up orthotics.  

## 2020-08-02 ENCOUNTER — Telehealth: Payer: Self-pay | Admitting: Podiatry

## 2020-08-02 NOTE — Telephone Encounter (Signed)
Pt left message stating she has been waiting over a month for her orthotics and received a bill recently for them.  I returned call and left 2nd  message that the orthotics are in and to call to schedule an appt .

## 2020-08-15 ENCOUNTER — Telehealth: Payer: Self-pay | Admitting: Podiatry

## 2020-08-15 NOTE — Telephone Encounter (Signed)
Pt left message asking if we had any available appts this week to pick up her orthotics.  I returned call and left message that we do have availability tomorrow 4.6.2022 and to call back to schedule an appt.

## 2020-08-16 ENCOUNTER — Other Ambulatory Visit: Payer: Self-pay

## 2020-08-16 ENCOUNTER — Ambulatory Visit (INDEPENDENT_AMBULATORY_CARE_PROVIDER_SITE_OTHER): Payer: Managed Care, Other (non HMO) | Admitting: Podiatry

## 2020-08-16 DIAGNOSIS — M778 Other enthesopathies, not elsewhere classified: Secondary | ICD-10-CM

## 2020-08-16 NOTE — Patient Instructions (Signed)

## 2020-08-16 NOTE — Progress Notes (Signed)
Patient presents today for orthotic pick up. Patient voices no new complaints.  Orthotics were fitted to patient's feet. No discomfort and no rubbing. Patient was satisfied with the orthotics.  Orthotics were dispensed to patient with instructions for break in wear and to call the office with any concerns or questions.
